# Patient Record
Sex: Male | Born: 1985 | Race: White | Hispanic: No
Health system: Southern US, Community
[De-identification: ages and names within clinical notes are randomized; demographics above are authoritative.]

---

## 2020-04-08 ENCOUNTER — Other Ambulatory Visit (HOSPITAL_BASED_OUTPATIENT_CLINIC_OR_DEPARTMENT_OTHER): Payer: Self-pay | Admitting: Family Medicine

## 2020-04-08 ENCOUNTER — Other Ambulatory Visit: Payer: Self-pay

## 2020-04-08 ENCOUNTER — Ambulatory Visit (HOSPITAL_BASED_OUTPATIENT_CLINIC_OR_DEPARTMENT_OTHER)
Admission: RE | Admit: 2020-04-08 | Discharge: 2020-04-08 | Disposition: A | Discharge status: Home / Self Care | Payer: BLUE CROSS/BLUE SHIELD | Source: Ambulatory Visit | Attending: Family Medicine | Admitting: Family Medicine

## 2020-04-08 DIAGNOSIS — N50812 Left testicular pain: Secondary | ICD-10-CM | POA: Insufficient documentation

## 2021-04-23 IMAGING — US US SCROTUM W/ DOPPLER COMPLETE
1 series · 13 of 25 positions shown · non-contrast
Comparison: None.

CLINICAL DATA: Left testicle pain

EXAM:
SCROTAL ULTRASOUND
DOPPLER ULTRASOUND OF THE TESTICLES
TECHNIQUE: Complete ultrasound examination of the testicles, epididymis, and
other scrotal structures was performed. Color and spectral Doppler
ultrasound were also utilized to evaluate blood flow to the
testicles.

[Series 1: us scrotum w/ doppler complete · 13 of 35 slices shown]
[im 1/35]
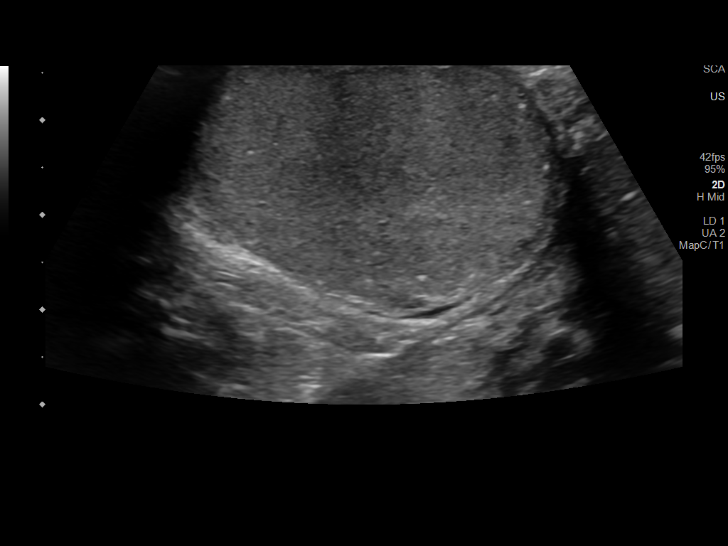
[im 3/35]
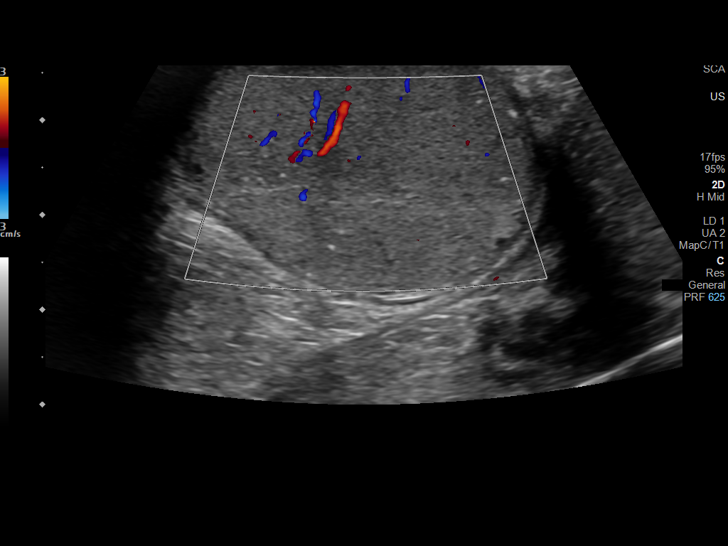
[im 6/35]
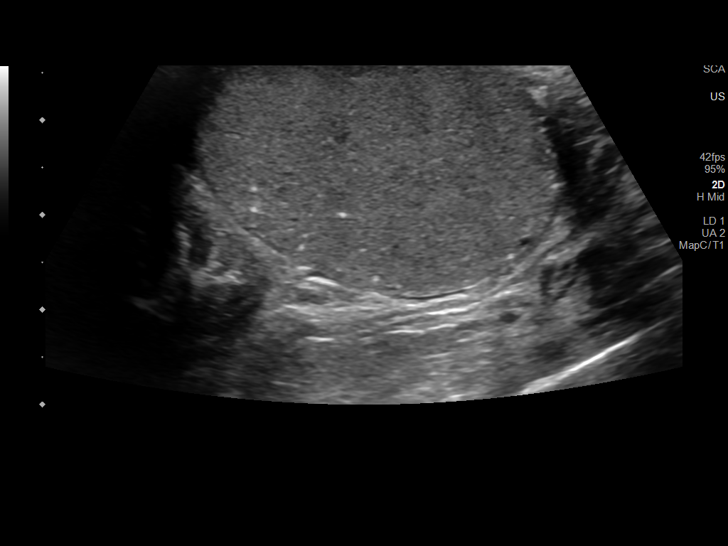
[im 9/35]
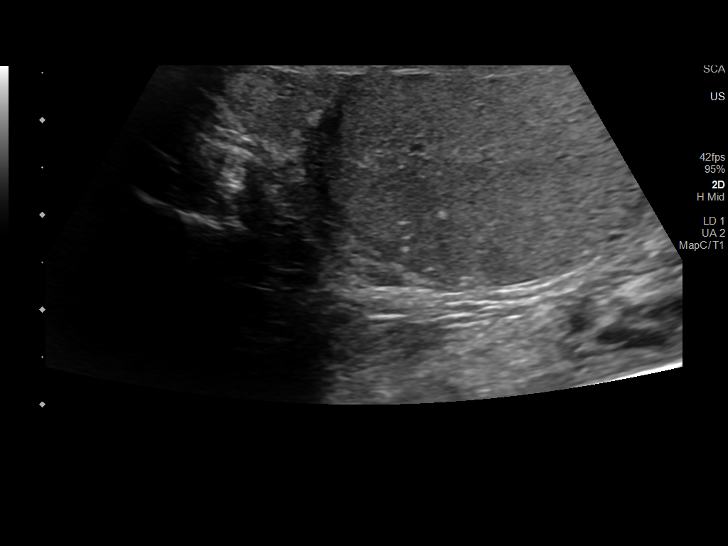
[im 12/35]
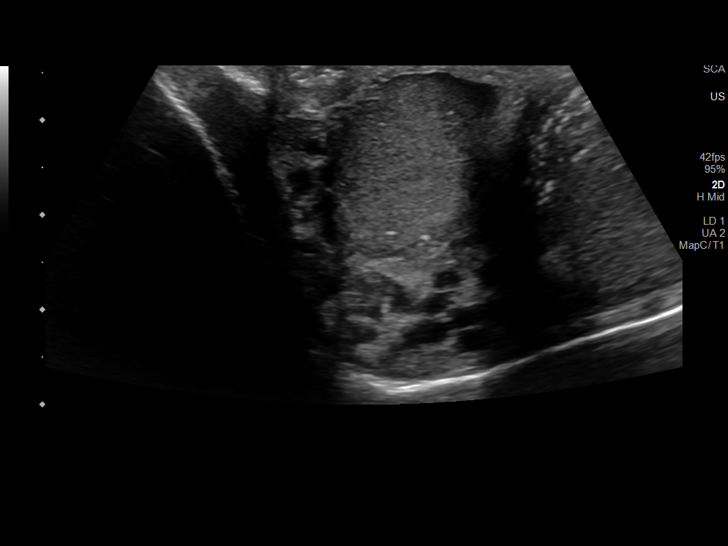
[im 15/35]
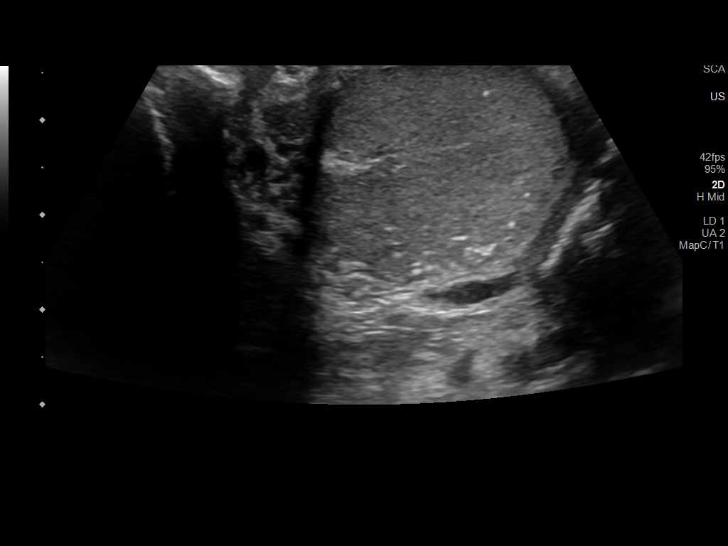
[im 18/35]
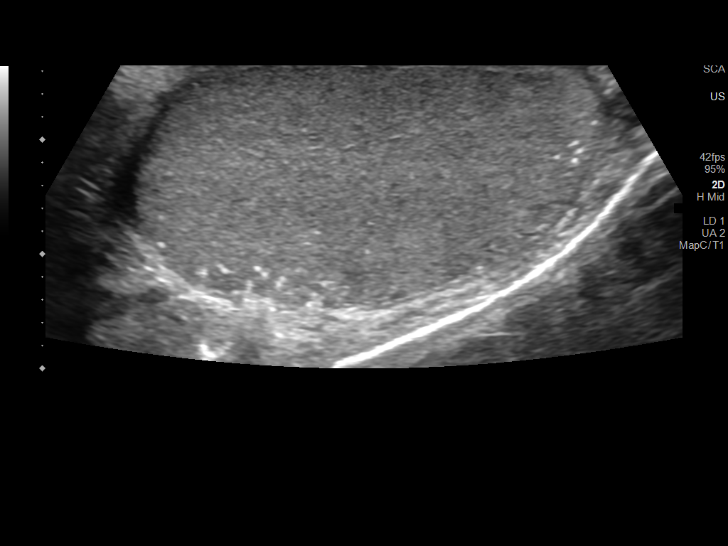
[im 20/35]
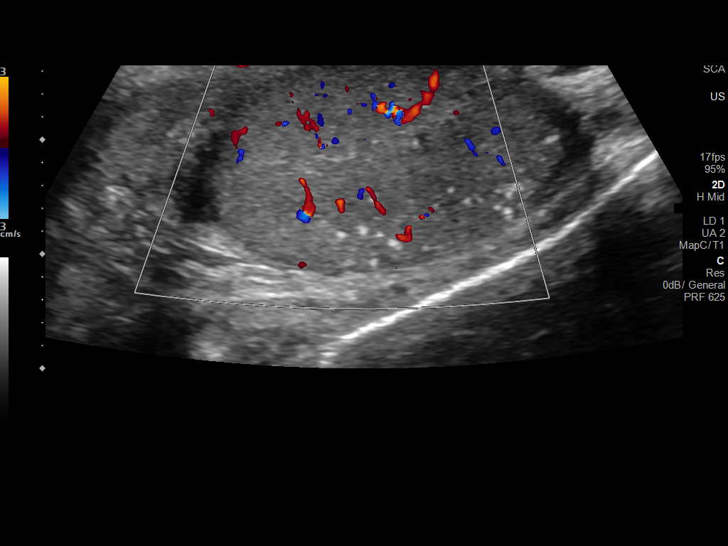
[im 23/35]
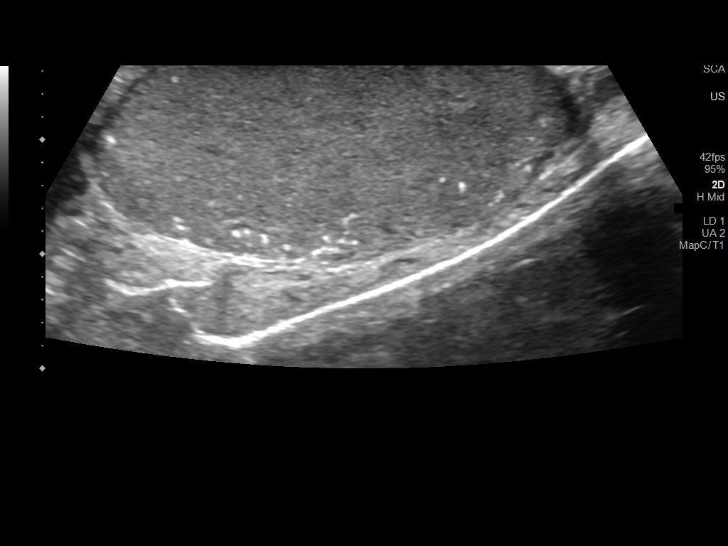
[im 26/35]
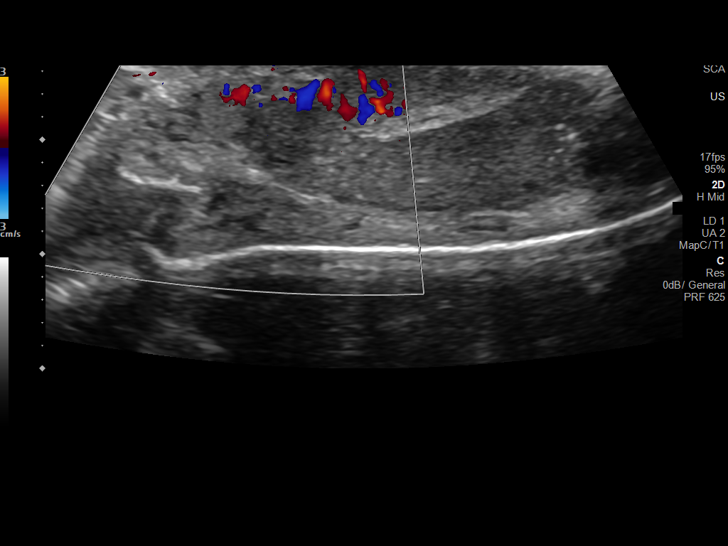
[im 29/35]
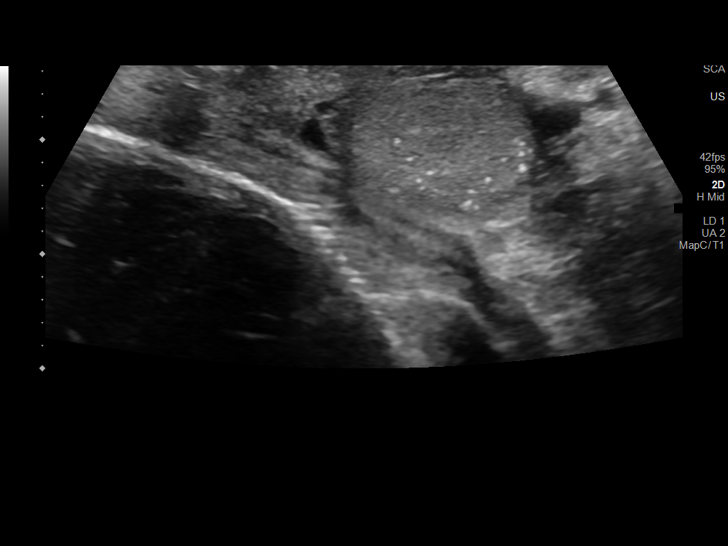
[im 32/35]
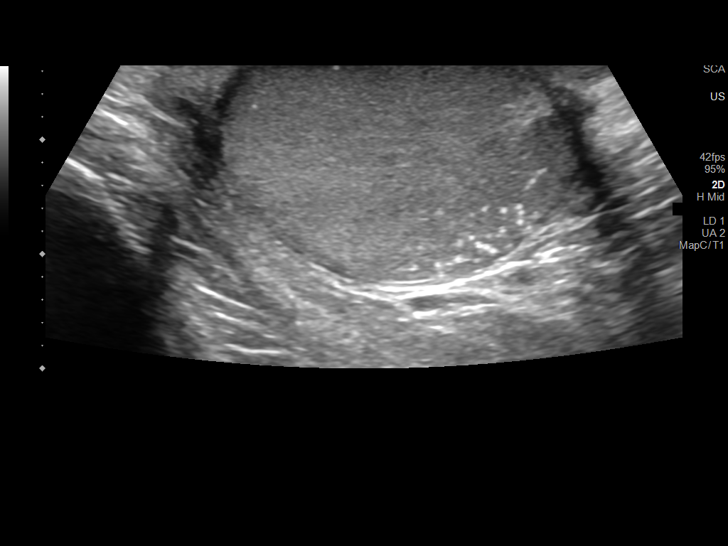
[im 35/35]
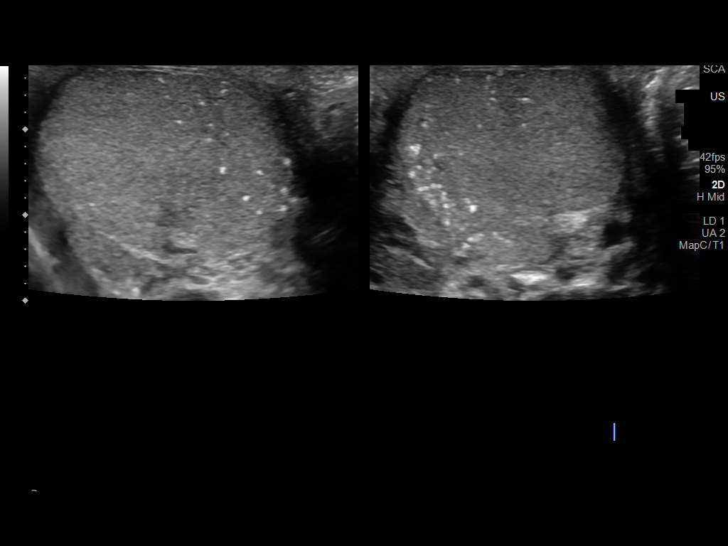

[13 of 25 positions shown; findings below may reference images not displayed]

FINDINGS: Right testicle

Measurements: 3.9 x 2.5 x 2.8 cm. No mass visualized. Multiple
microcalcifications identified.

Left testicle

Measurements: 4.0 x 2.2 x 2.9 cm. No mass visualized. Multiple
microcalcifications identified.

Right epididymis:  Normal in size and appearance.

Left epididymis:  Normal in size and appearance.

Hydrocele:  None visualized.

Varicocele:  None visualized.

Pulsed Doppler interrogation of both testes demonstrates normal low
resistance arterial and venous waveforms bilaterally.
IMPRESSION: 1. No testicular mass or signs of testicular torsion.
2. Bilateral testicular microlithiasis. Current literature suggests
that testicular microlithiasis is not a significant independent risk
factor for development of testicular carcinoma, and that follow up
imaging is not warranted in the absence of other risk factors.
Monthly testicular self-examination and annual physical exams are
considered appropriate surveillance. If patient has other risk
factors for testicular carcinoma, then referral to Urology should be
considered. (Reference: Niemann, et al.: A 5-Year Follow up Study
of Asymptomatic Men with Testicular Microlithiasis. J Urol 1223;

## 2022-01-27 ENCOUNTER — Ambulatory Visit (INDEPENDENT_AMBULATORY_CARE_PROVIDER_SITE_OTHER): Payer: BLUE CROSS/BLUE SHIELD

## 2022-01-27 ENCOUNTER — Ambulatory Visit (INDEPENDENT_AMBULATORY_CARE_PROVIDER_SITE_OTHER): Payer: BLUE CROSS/BLUE SHIELD | Admitting: Family

## 2022-01-27 DIAGNOSIS — M7671 Peroneal tendinitis, right leg: Secondary | ICD-10-CM | POA: Diagnosis not present

## 2022-01-27 DIAGNOSIS — M79671 Pain in right foot: Secondary | ICD-10-CM

## 2022-01-29 ENCOUNTER — Encounter: Payer: Self-pay | Admitting: Family

## 2022-01-29 NOTE — Progress Notes (Signed)
   Office Visit Note   Patient: George Martinez           Date of Birth: 1985/11/20           MRN: [id number] Visit Date: 01/27/2022              Requested by: No referring provider defined for this encounter. PCP: Patient, No Pcp Per  Chief Complaint  Patient presents with   Right Foot - Pain      HPI: The patient is a 36 year old gentleman who presents today complaining of right foot pain this has been ongoing since July this time he states he has had previous episodes of similar pain he wonders if he has plantar fasciitis he is having pain beneath his foot primarily laterally.  He has gotten new [employer] walking shoes which has not helped much with his pain he has pain all day gradually worsens with walking.  He has used Tylenol and ibuprofen for just 1 day for his pain without much improvement some radiation of his pain to the top of his foot  Assessment & Plan: Visit Diagnoses:  1. Pain in right foot   2. Peroneal tendinitis, right leg     Plan: Peroneal tendinitis on the right.  Discussed using supportive shoewear stretching anti-inflammatories he will use Aleve twice daily for the next 2 weeks  Follow-Up Instructions: Return in about 4 weeks (around 02/24/2022).   Ortho Exam  Patient is alert, oriented, no adenopathy, well-dressed, normal affect, normal respiratory effort. On examination of the right foot the foot is plantigrade there is no edema no erythema he does have tenderness along the course of the peroneal tendon and the insertion.  Pain with resisted eversion.  The origin of the plantar fascia is nontender no pain with lateral compression of the calcaneus  Imaging: No results found. No images are attached to the encounter.  Labs: No results found for: "HGBA1C", "ESRSEDRATE", "CRP", "LABURIC", "REPTSTATUS", "GRAMSTAIN", "CULT", "LABORGA"   No results found for: "ALBUMIN", "PREALBUMIN", "CBC"  No results found for: "MG" No results found for: "VD25OH"  No results  found for: "PREALBUMIN"     No data to display           There is no height or weight on file to calculate BMI.  Orders:  Orders Placed This Encounter  Procedures   XR Foot Complete Right   No orders of the defined types were placed in this encounter.    Procedures: No procedures performed  Clinical Data: No additional findings.  ROS:  All other systems negative, except as noted in the HPI. Review of Systems  Objective: Vital Signs: There were no vitals taken for this visit.  Specialty Comments:  No specialty comments available.  PMFS History: There are no problems to display for this patient.  History reviewed. No pertinent past medical history.  History reviewed. No pertinent family history.  History reviewed. No pertinent surgical history. Social History   Occupational History   Not on file  Tobacco Use   Smoking status: Not on file   Smokeless tobacco: Not on file  Substance and Sexual Activity   Alcohol use: Not on file   Drug use: Not on file   Sexual activity: Not on file
